# Patient Record
Sex: Male | Born: 1935
Health system: Southern US, Community
[De-identification: ages and names within clinical notes are randomized; demographics above are authoritative.]

## PROBLEM LIST (undated history)

## (undated) DIAGNOSIS — Z129 Encounter for screening for malignant neoplasm, site unspecified: Secondary | ICD-10-CM

## (undated) DIAGNOSIS — R030 Elevated blood-pressure reading, without diagnosis of hypertension: Secondary | ICD-10-CM

## (undated) DIAGNOSIS — IMO0001 Reserved for inherently not codable concepts without codable children: Secondary | ICD-10-CM

## (undated) DIAGNOSIS — S6990XA Unspecified injury of unspecified wrist, hand and finger(s), initial encounter: Secondary | ICD-10-CM

## (undated) HISTORY — DX: Encounter for screening for malignant neoplasm, site unspecified: Z12.9

## (undated) HISTORY — DX: Unspecified injury of unspecified wrist, hand and finger(s), initial encounter: S69.90XA

## (undated) HISTORY — DX: Reserved for inherently not codable concepts without codable children: IMO0001

## (undated) HISTORY — DX: Elevated blood-pressure reading, without diagnosis of hypertension: R03.0

---

## 2001-08-23 HISTORY — PX: SIGMOIDOSCOPY: SUR1295

## 2010-08-23 DIAGNOSIS — Z129 Encounter for screening for malignant neoplasm, site unspecified: Secondary | ICD-10-CM

## 2010-08-23 DIAGNOSIS — S6990XA Unspecified injury of unspecified wrist, hand and finger(s), initial encounter: Secondary | ICD-10-CM

## 2010-08-23 HISTORY — DX: Unspecified injury of unspecified wrist, hand and finger(s), initial encounter: S69.90XA

## 2010-08-23 HISTORY — DX: Encounter for screening for malignant neoplasm, site unspecified: Z12.9

## 2011-05-07 ENCOUNTER — Emergency Department (INDEPENDENT_AMBULATORY_CARE_PROVIDER_SITE_OTHER): Payer: Medicare PPO

## 2011-05-07 ENCOUNTER — Emergency Department (HOSPITAL_BASED_OUTPATIENT_CLINIC_OR_DEPARTMENT_OTHER)
Admission: EM | Admit: 2011-05-07 | Discharge: 2011-05-07 | Disposition: A | Discharge status: Home / Self Care | Payer: Medicare PPO | Attending: Emergency Medicine | Admitting: Emergency Medicine

## 2011-05-07 ENCOUNTER — Emergency Department (HOSPITAL_BASED_OUTPATIENT_CLINIC_OR_DEPARTMENT_OTHER): Payer: Medicare PPO

## 2011-05-07 ENCOUNTER — Encounter: Payer: Self-pay | Admitting: *Deleted

## 2011-05-07 DIAGNOSIS — T07XXXA Unspecified multiple injuries, initial encounter: Secondary | ICD-10-CM

## 2011-05-07 DIAGNOSIS — W19XXXA Unspecified fall, initial encounter: Secondary | ICD-10-CM

## 2011-05-07 DIAGNOSIS — S025XXA Fracture of tooth (traumatic), initial encounter for closed fracture: Secondary | ICD-10-CM | POA: Insufficient documentation

## 2011-05-07 DIAGNOSIS — W010XXA Fall on same level from slipping, tripping and stumbling without subsequent striking against object, initial encounter: Secondary | ICD-10-CM | POA: Insufficient documentation

## 2011-05-07 DIAGNOSIS — S63259A Unspecified dislocation of unspecified finger, initial encounter: Secondary | ICD-10-CM | POA: Insufficient documentation

## 2011-05-07 DIAGNOSIS — M25549 Pain in joints of unspecified hand: Secondary | ICD-10-CM

## 2011-05-07 MED ORDER — LIDOCAINE HCL (PF) 1 % IJ SOLN
5.0000 mL | Freq: Once | INTRAMUSCULAR | Status: AC
  Administered 2011-05-07: 5 mL

## 2011-05-07 MED ORDER — TETANUS-DIPHTH-ACELL PERTUSSIS 5-2.5-18.5 LF-MCG/0.5 IM SUSP
0.5000 mL | Freq: Once | INTRAMUSCULAR | Status: AC
  Administered 2011-05-07: 0.5 mL via INTRAMUSCULAR
  Filled 2011-05-07: qty 0.5

## 2011-05-07 MED ORDER — LIDOCAINE HCL (PF) 1 % IJ SOLN
INTRAMUSCULAR | Status: AC
  Administered 2011-05-07: 5 mL
  Filled 2011-05-07: qty 5

## 2011-05-07 NOTE — ED Notes (Signed)
Tripped and fell on his face. Broke 2 front teeth. Abrasions noted to his face and left hand. Bruise to is right 4th digit.

## 2011-05-07 NOTE — ED Provider Notes (Signed)
History     CSN: [id number] Arrival date & time: 05/07/2011 12:43 PM   Chief Complaint  Patient presents with  . Fall     (Include location/radiation/quality/duration/timing/severity/associated sxs/prior treatment) HPI The patient is an 77 year old male with no significant past medical history.  He presents to the emergency department after he fell over a curb and landed on his face.  He denies loss of consciousness, nausea, vision changes, neck pain.  He denies a headache.  He says he has mild discomfort in his upper lip, where he has an abrasion.  He points out an injury to his left small finger, which he says is nonpainful.  He denies significant pain anywhere.   History reviewed. No pertinent past medical history.   History reviewed. No pertinent past surgical history.  No family history on file.  History  Substance Use Topics  . Smoking status: Former Games developer  . Smokeless tobacco: Not on file  . Alcohol Use: No      Review of Systems  HENT: Negative for nosebleeds and neck pain.   Eyes: Negative for pain and visual disturbance.  Respiratory: Negative for shortness of breath.   Cardiovascular: Negative for chest pain.  Gastrointestinal: Negative for abdominal pain.  Musculoskeletal: Negative for back pain.  Skin:       Abrasions on the nose upper lip bilateral hands dorsally  Neurological: Negative for headaches.  Hematological: Bruises/bleeds easily.       Bruise on the dorsum of his left hand    Allergies  Review of patient's allergies indicates no known allergies.  Home Medications  No current outpatient prescriptions on file.  Physical Exam    BP 139/81  Pulse 51  Temp(Src) 97.4 F (36.3 C) (Oral)  Resp 20  SpO2 99%  Physical Exam  Constitutional: He appears well-developed and well-nourished.  HENT:  Head: Normocephalic.       Abrasions on his nose, and upper lip with a small laceration on his lip that he does not want repaired  Neck: Normal  range of motion. Neck supple.       No neck tenderness  Pulmonary/Chest: Effort normal.  Musculoskeletal:       There is ecchymoses on the ulnar and dorsal side of his left hand with a deformity of his left small finger is flexed at the PIP, and he is not able to extend it  Skin: Skin is warm and dry.       Abrasions to the nose face, bilateral hands  Psychiatric: He has a normal mood and affect.    ED Course  Procedures  DIGITAL BLOCK with lidocaine without epi for joint reduction. PIP left 5th digit reduction  Following an elderly male, with no significant injury to his head or axial skeleton.  He does have abrasions on his face and hands and a flexion deformity of his left small finger.  We will x-ray his hand and finger given a tetanus shot and cleaned.  His wound  No results found for this or any previous visit. No results found.   No diagnosis found.   MDM Facial abrasion Left small finger injury- dislocation- reduced in ed. Rennis Harding 1 dental fracture       Nicholes Stairs, MD 05/07/11 [phone]

## 2011-05-07 NOTE — ED Notes (Signed)
Pt's abrasions cleaned and covered with bacitracin. Pt tolerated well. No bleeding noted.

## 2011-09-27 ENCOUNTER — Encounter: Payer: Self-pay | Admitting: Sports Medicine

## 2011-09-27 ENCOUNTER — Ambulatory Visit (INDEPENDENT_AMBULATORY_CARE_PROVIDER_SITE_OTHER): Payer: Medicare PPO | Admitting: Sports Medicine

## 2011-09-27 DIAGNOSIS — R030 Elevated blood-pressure reading, without diagnosis of hypertension: Secondary | ICD-10-CM

## 2011-09-27 DIAGNOSIS — J329 Chronic sinusitis, unspecified: Secondary | ICD-10-CM

## 2011-09-27 DIAGNOSIS — M20022 Boutonniere deformity of left finger(s): Secondary | ICD-10-CM

## 2011-09-27 DIAGNOSIS — M9904 Segmental and somatic dysfunction of sacral region: Secondary | ICD-10-CM

## 2011-09-27 DIAGNOSIS — M20029 Boutonniere deformity of unspecified finger(s): Secondary | ICD-10-CM

## 2011-09-27 DIAGNOSIS — M999 Biomechanical lesion, unspecified: Secondary | ICD-10-CM

## 2011-09-27 DIAGNOSIS — M9903 Segmental and somatic dysfunction of lumbar region: Secondary | ICD-10-CM

## 2011-09-27 DIAGNOSIS — R7301 Impaired fasting glucose: Secondary | ICD-10-CM | POA: Insufficient documentation

## 2011-09-27 NOTE — Patient Instructions (Signed)
It was great to meet you today.  You have done an excellent job of taking care of yourself and right now I feel that you are very well tuned up.  I do not want to start any new medications but would like to see you back in 1 month for more OMT to help you with your back.    For your sinuses you can try the Netti Pot or Saline rinses.  Zyrtec or Claritin is a good Over the Counter Medication if you feel like your congestion is getting worse and you need something.  For you pinky finger, we will give you a splint to use at night and I want you stretch your finger every day multiple times per day.    I will see you back in around a month and look forward to getting to know you better.

## 2011-09-27 NOTE — Assessment & Plan Note (Signed)
Pt diet controlled with high activity level although decreased since a fall in Sept 2012.  Essentially a vegetarian with small to moderate amounts of grains.

## 2011-09-27 NOTE — Assessment & Plan Note (Signed)
Pt given long finger splint and instructed to wear at night. Not currently limiting his functional status.  Active stretching demonstrated and to be performed multiple times throughout the day

## 2011-09-27 NOTE — Assessment & Plan Note (Addendum)
132 / 82 on recheck on 09/27/2011 Stable - no medication additions at this time.

## 2011-09-30 ENCOUNTER — Encounter: Payer: Self-pay | Admitting: Sports Medicine

## 2011-09-30 DIAGNOSIS — M9902 Segmental and somatic dysfunction of thoracic region: Secondary | ICD-10-CM | POA: Insufficient documentation

## 2011-09-30 DIAGNOSIS — M9903 Segmental and somatic dysfunction of lumbar region: Secondary | ICD-10-CM | POA: Insufficient documentation

## 2011-09-30 DIAGNOSIS — M9905 Segmental and somatic dysfunction of pelvic region: Secondary | ICD-10-CM | POA: Insufficient documentation

## 2011-09-30 DIAGNOSIS — J329 Chronic sinusitis, unspecified: Secondary | ICD-10-CM | POA: Insufficient documentation

## 2011-09-30 NOTE — Assessment & Plan Note (Addendum)
Treated with OMT:  MFR (Myofascial Release) and ME (Muscle Energy), Still Technique and Long lever to PELVIS- tolerated procedure well

## 2011-09-30 NOTE — Assessment & Plan Note (Addendum)
Treated with OMT: MFR (Myofascial Release) and ME (Muscle Energy) to THORACICS- tolerated procedure well

## 2011-09-30 NOTE — Progress Notes (Signed)
Subjective:  Marc Collins is a 78 y.o. male presenting today to establish care as he has moved in this his Daughter due to concerns regarding his health.  He is currently able perform all ADLs and iADLs and returns to his home outside of [place] for 4 to 5 days at a time.  He does report having some increased issues with his balance although denies any falls within the last month.  He was seen in the ED in 06/2011 for a fall which resulted in a L sided hand injury.  Otherwise pt is helathy and takes no medications.  He has been previously followed by Dr. Lorelei Pont, DO in [facility] and has provided labs from his last visit.    ROS  Constitutional Reports some decreased activity since his fall in sept but denies any focal weakness, no generalized fatigue, malise, no unexplained wt loss  Infectious No cough, no congestion, no fevers, no chills  Resp No cough  Cardiac No palpitations, no chest pains  GI No N/V, or reflux, no constipation, or diarrhea  GU No dysuria, frequency, hesistancy  Skin No abnormal bruising  Psych Denies feelings of depression or anxiety  Neuro No focal weakness, dysarthia, or tremors  MSK REports some R sided hip pain since his fall and occasional misstep but no focal weakness or pain; L 5th finger with contracture  Trauma None since fall in sept 2012  Activity Highly active able to perform any activity he wishes  Diet Eats a variety of foods and has high vegetable intake, "essentially vegetarian"  MEDS On no meds and reports not wanting to start unless clearly indicated    Past Medical Hx Reviewed: yes Medications Reviewed: yes Family History Reviewed: yes   PE: GENERAL:  Elderly, caucasian male examined in [facility], in no acute discomfort, in no resp distress HNEENT: AT/[place], MMM, no scleral icterus, EOMi THORAX: HEART: irregular rhythm but coordinated with resp effort, S1/S2 heard, no murmur LUNGS: CTA B, no wheezes, no crackles ABDOMEN:  +BS,  soft, non-tender, no rigidity, no guarding, no masses/organomegaly EXTREMITIES: Moves all 4 extremities spontaneously, warm well perfused, no edema, bilateral DP pulses 1+/4.  L hand with flexure contracture of 5th PIP - unable to straighten >MSK/Osteopathic:  OSTEOPATHIC FINDINGS: THORACIC  T8-T12 rRsL  LUMBAR  L3 rRsL  PELVIS  R anterior rotation

## 2011-09-30 NOTE — Assessment & Plan Note (Addendum)
Pt currently denying symptoms.  Pt has no reported OTC treatments.  Discussed OTC 2nd gen antihistamine and Saline spray/rinse

## 2011-09-30 NOTE — Assessment & Plan Note (Addendum)
Treated with OMT: MFR (Myofascial Release) and ME (Muscle Energy) to lumbar spine - tolerated procedure well

## 2011-10-26 ENCOUNTER — Ambulatory Visit (INDEPENDENT_AMBULATORY_CARE_PROVIDER_SITE_OTHER): Payer: Medicare PPO | Admitting: Sports Medicine

## 2011-10-26 ENCOUNTER — Encounter: Payer: Self-pay | Admitting: Sports Medicine

## 2011-10-26 VITALS — BP 132/78 | HR 48 | Ht 72.0 in | Wt 178.0 lb

## 2011-10-26 DIAGNOSIS — M9903 Segmental and somatic dysfunction of lumbar region: Secondary | ICD-10-CM

## 2011-10-26 DIAGNOSIS — R030 Elevated blood-pressure reading, without diagnosis of hypertension: Secondary | ICD-10-CM

## 2011-10-26 DIAGNOSIS — M9905 Segmental and somatic dysfunction of pelvic region: Secondary | ICD-10-CM

## 2011-10-26 DIAGNOSIS — M9902 Segmental and somatic dysfunction of thoracic region: Secondary | ICD-10-CM

## 2011-10-26 DIAGNOSIS — M999 Biomechanical lesion, unspecified: Secondary | ICD-10-CM

## 2011-10-26 NOTE — Patient Instructions (Addendum)
It was great to see you today.  We worked on your ankles, pelvis, back and ribs today.  You may be slightly sore tomorrow - it is okay to take Aleeve if you need it and use ice or heat as needed.  I anticipate you will feel great in a day or two.  Take care of yourself when you return home.    Come back to see me in 1 month for to continue your medical care and for another musculoskeletal assessment.

## 2011-11-20 NOTE — Assessment & Plan Note (Signed)
Elevated after walking into visit.  On recheck appropriate.  Pt reports no elevations when he shelf checks at home. Continue to monitor - pt resistant to addition of meds   

## 2011-11-20 NOTE — Assessment & Plan Note (Signed)
Treated with OMT to Thoracics, Ribs, Lumbar, Pelvis with MFR (Myofascial Release), ME (Muscle Energy)  Pt tolerated treatments well

## 2011-11-20 NOTE — Progress Notes (Signed)
HPI:  Marc Collins is a 78 y.o. male presenting today for OMT.  He reports doing well since last visit.  He has had no falls.  He reports that his chronic low back pain is improved with manipulation.  It does feel like he is currently "out" of alignment especially the tips.   ROS Denies any chest pain, shortness of breath, recent respiratory illnesses, changes in bowel or bladder habits, or any medication or medical changes since last visit.  Past Medical Hx Reviewed: yes Medications Reviewed: yes Family History Reviewed: no   PE: GENERAL:  Elderly Adult Caucasian male male.  Examined in [facility].  Sitting comfortably in exam room.  In no discomfort; norespiratory distress.   PSYCH: Alert and appropriately interactive  HNEENT: AT/[place], MMM, no scleral icterus, EOMi THORAX: HEART: RRR, S1/S2 heard, no murmur LUNGS: CTA B, no wheezes, no crackles EXTREMITIES: Moves all 4 extremities spontaneously, warm well perfused, no edema, bilateral DP and PT pulses 2/4.   >MSK/Osteopathic: OSTEOPATHIC FINDINGS: THORACIC  t5-7 NRRSL    T11-L2 NRLSR  RIBS  R Rib 7 posterior  LUMBAR  L3RR  PELVIS  R psoas contracture  R Anterior pelvis  LE  R Fibular head anterior

## 2011-12-03 ENCOUNTER — Ambulatory Visit (INDEPENDENT_AMBULATORY_CARE_PROVIDER_SITE_OTHER): Payer: Medicare PPO | Admitting: Sports Medicine

## 2011-12-03 VITALS — BP 118/76 | HR 72 | Temp 97.8°F | Ht 72.0 in | Wt 176.9 lb

## 2011-12-03 DIAGNOSIS — M999 Biomechanical lesion, unspecified: Secondary | ICD-10-CM

## 2011-12-03 DIAGNOSIS — M9903 Segmental and somatic dysfunction of lumbar region: Secondary | ICD-10-CM

## 2011-12-03 DIAGNOSIS — M9902 Segmental and somatic dysfunction of thoracic region: Secondary | ICD-10-CM

## 2011-12-03 DIAGNOSIS — M9905 Segmental and somatic dysfunction of pelvic region: Secondary | ICD-10-CM

## 2011-12-03 DIAGNOSIS — R030 Elevated blood-pressure reading, without diagnosis of hypertension: Secondary | ICD-10-CM

## 2011-12-03 NOTE — Patient Instructions (Signed)
It was great to see you today.  I worked on your pelvis, sacrum, lumbar spine and thoracic spine today.  Your Left hip was rotated contributing to your "swagger".    Keep doing your dumbbell exercises and try to add in the hip lumbar rotation exercises that I showed you.  Perform 15 circles each direction with your feet together, then with your feet spread apart Left foot forward then right foot forward.  This will help keep your hips loose and make our treatments last longer.  Please return to see me in 4 to 6 weeks or sooner if needed.

## 2011-12-03 NOTE — Progress Notes (Signed)
HPI:  Whitman Meinhardt is a 78 y.o. male presenting today for follow up of his lumbar and pelvic assymetries and for a BP check.  Reports feeling good, no complications following adjustment previously.  Was doing well for 4-5 weeks but has lately feel like he has a "swagger"  ROS No falls, no chest pain, no palpitations   HISTORY Past Medical Hx Reviewed: yes Medications Reviewed: yes - no chronic Rx'd meds  PE: GENERAL:  Elderly Caucasian male.  Examined in [facility].   In no discomfort; norespiratory distress.   PSYCH: Alert and appropriately interactive  HNEENT: AT/[place], MMM, no scleral icterus, EOMi THORAX: HEART: Regular rate, regularly irregular rhythm,  S1/S2 heard, no murmur LUNGS: CTA b ABDOMEN:  Hyperactive BS, soft, non-tender >MSK/Osteopathic:  THORACIC  T6 RL  RIBS  P L Rib 6  LUMBAR  L3 FRLSL  L4 FRrSr  PELVIS  L posterior innominate

## 2011-12-09 ENCOUNTER — Encounter: Payer: Self-pay | Admitting: Sports Medicine

## 2011-12-09 DIAGNOSIS — M999 Biomechanical lesion, unspecified: Secondary | ICD-10-CM | POA: Insufficient documentation

## 2011-12-09 NOTE — Assessment & Plan Note (Signed)
OMT performed and tolerated treatment well: ME

## 2011-12-09 NOTE — Assessment & Plan Note (Signed)
OMT performed and tolerated well; STILL, ME

## 2011-12-09 NOTE — Assessment & Plan Note (Signed)
OMT Performed and tolerated well.  ME, CS

## 2011-12-09 NOTE — Assessment & Plan Note (Signed)
Elevated after walking into visit.  On recheck appropriate.  Pt reports no elevations when he shelf checks at home. Continue to monitor - pt resistant to addition of meds

## 2011-12-09 NOTE — Assessment & Plan Note (Signed)
OMT performed and tolerated well.  ME

## 2013-03-08 IMAGING — CR DG HAND COMPLETE 3+V*L*
4 series · 4 of 4 positions shown · non-contrast
Comparison: None.

CLINICAL DATA: Fell, pain

LEFT HAND - COMPLETE 3+ VIEW

[x hand pa left]
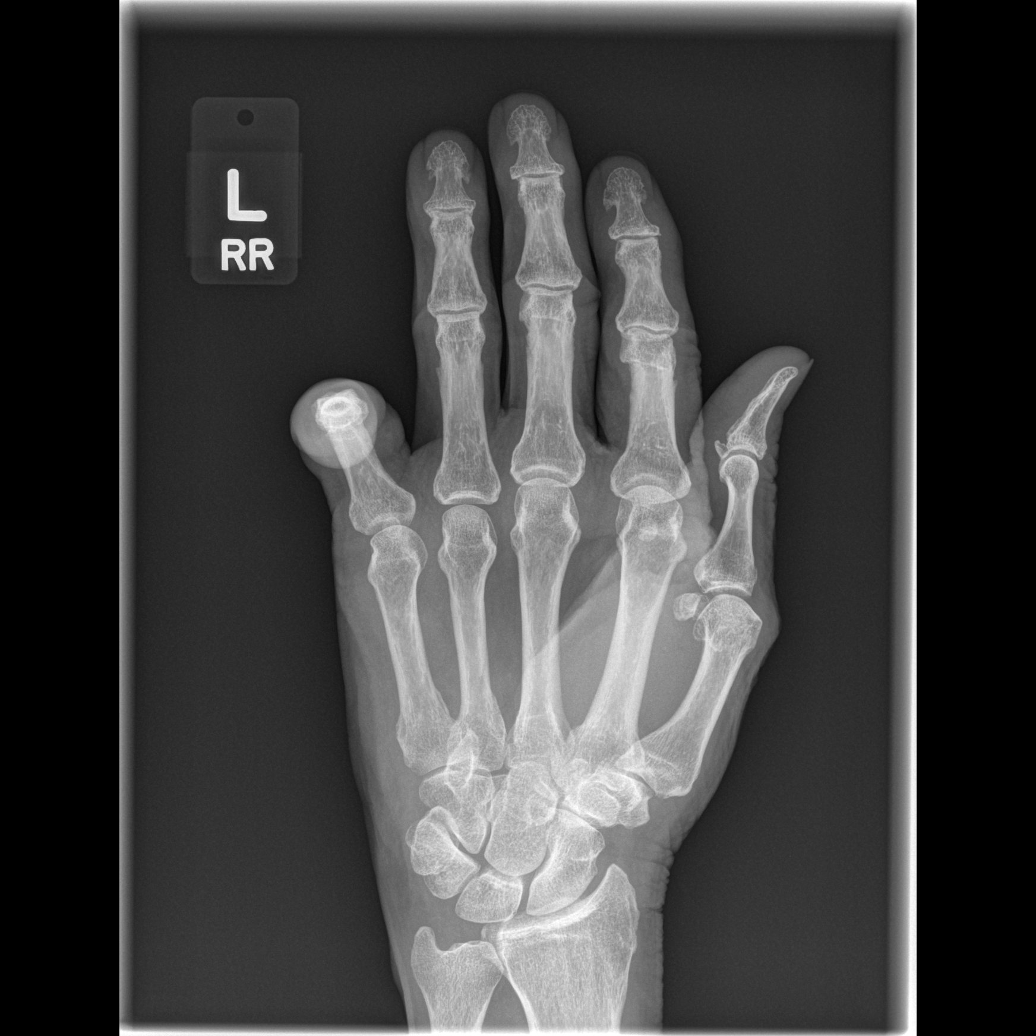

[x hand oblique left]
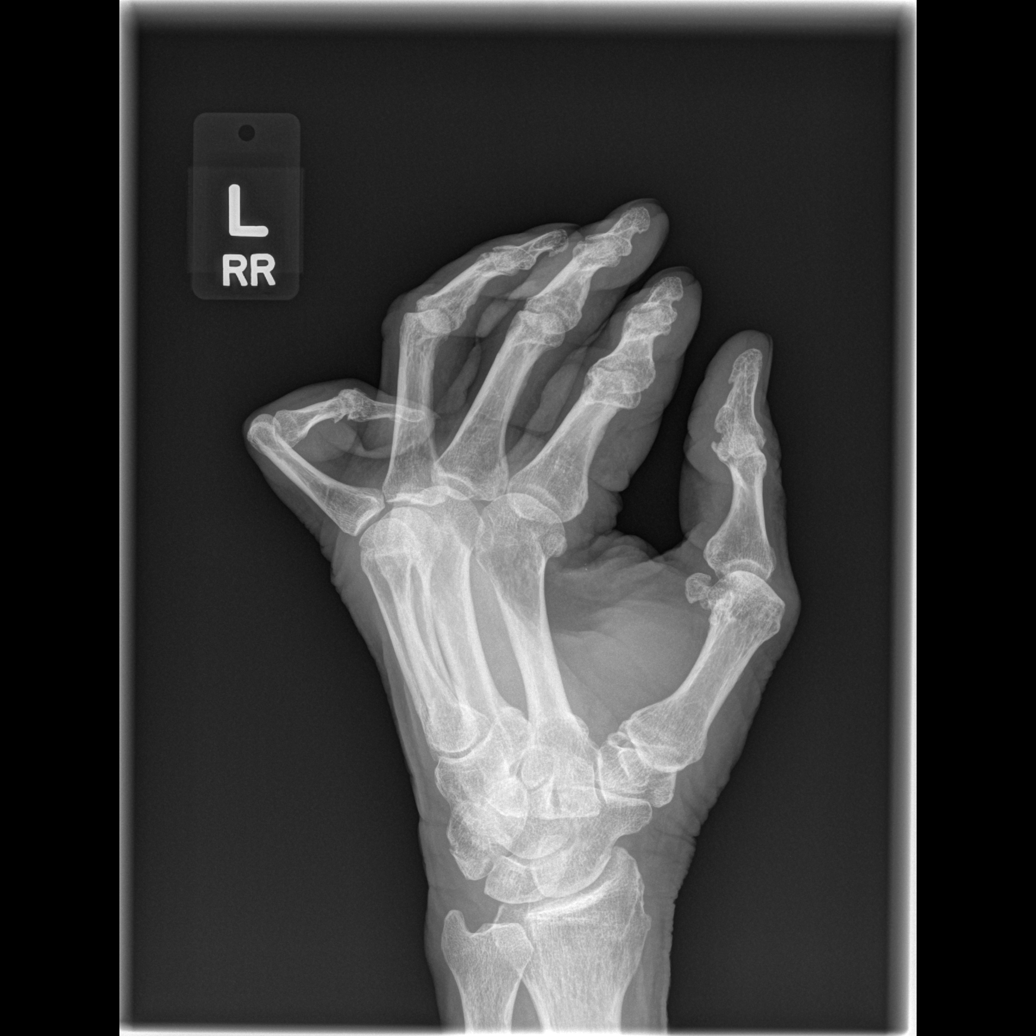

[x hand lat left (1 of 2)]
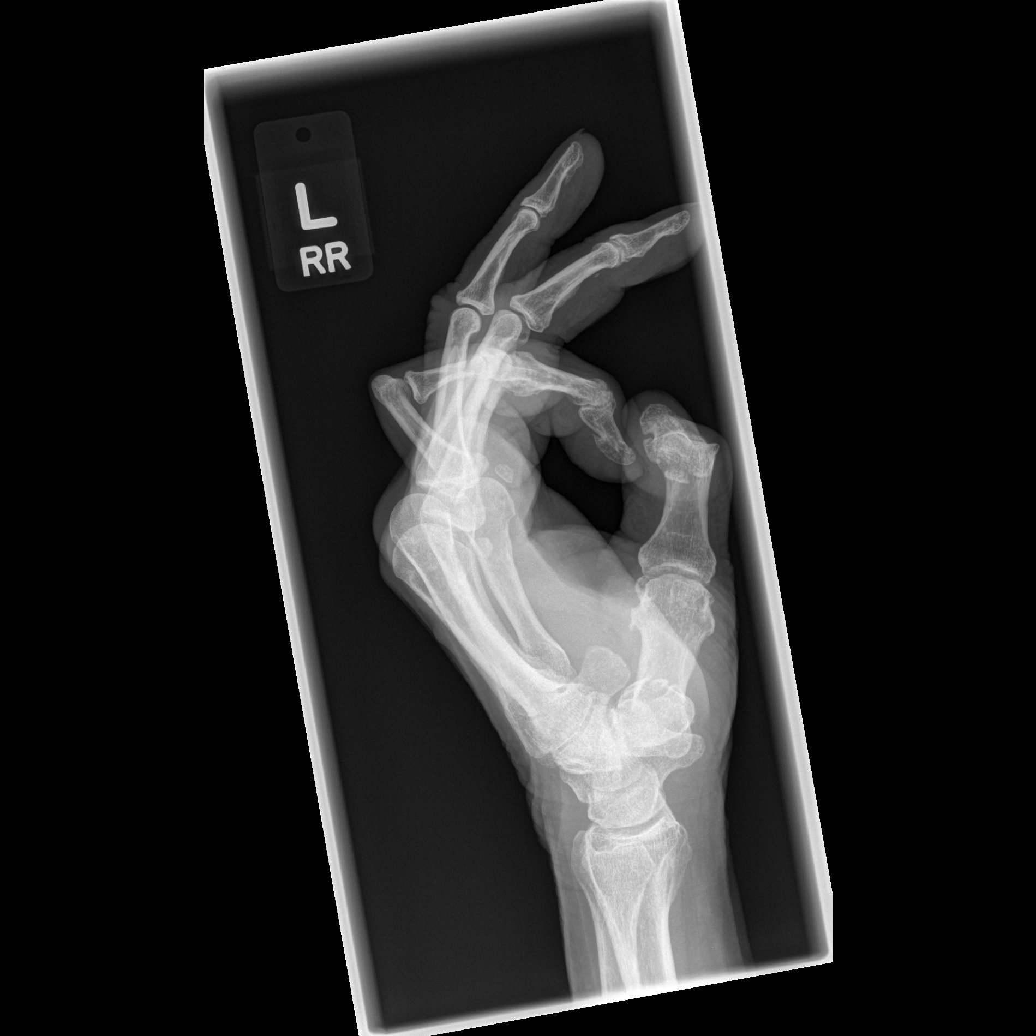

[x hand lat left (2 of 2)]
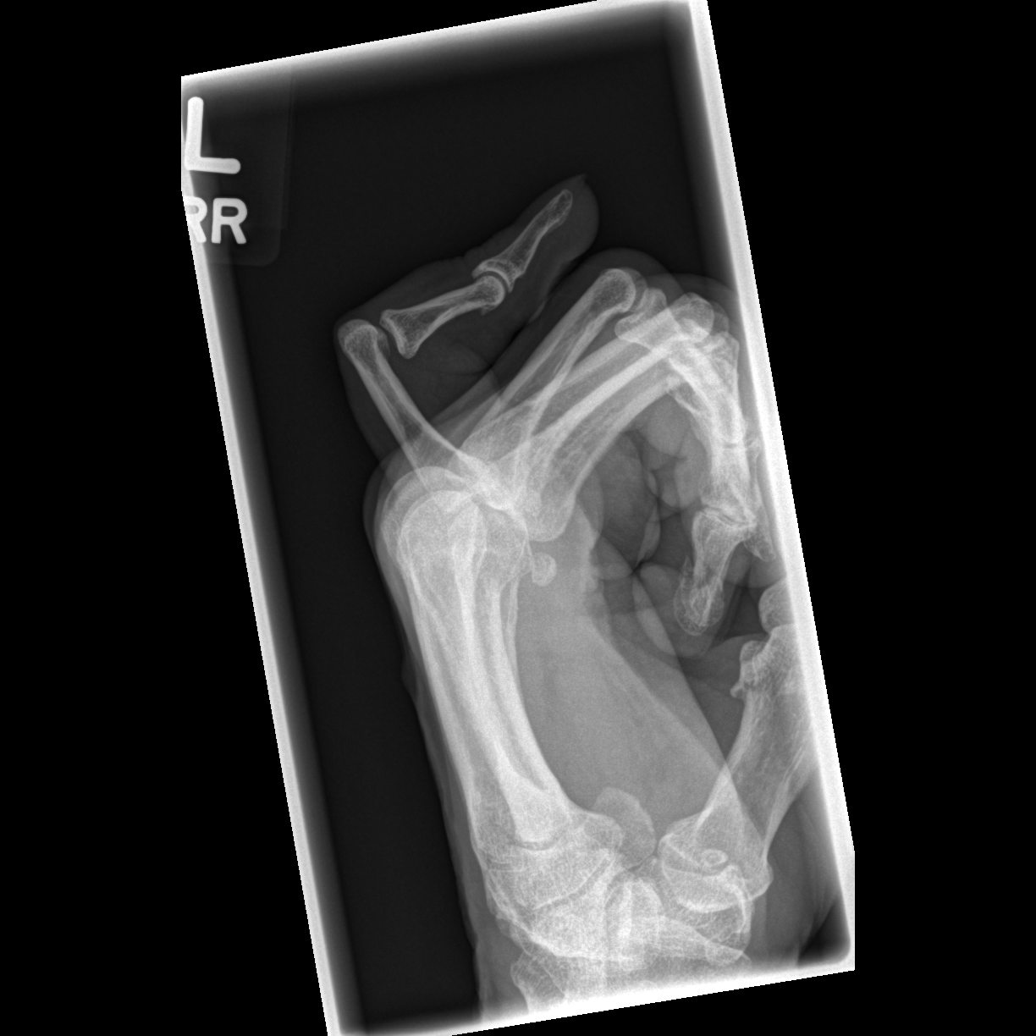

[4 of 4 positions shown; findings below may reference images not displayed]

FINDINGS: There is a dislocated the fifth finger PIP joint with the
distal finger directed toward the volar aspect.  There is no
visible fracture.  Mild degenerative changes seen throughout the
interphalangeal joints. Metacarpals and carpal bones appear intact.
No radiopaque foreign body.
IMPRESSION: Dislocated PIP joint fifth finger.  No definite associated
fracture.

## 2017-09-16 ENCOUNTER — Ambulatory Visit: Payer: PPO | Admitting: Podiatry

## 2017-09-16 ENCOUNTER — Encounter: Payer: Self-pay | Admitting: Podiatry

## 2017-09-16 VITALS — BP 162/92 | HR 95 | Ht 71.0 in | Wt 165.0 lb

## 2017-09-16 DIAGNOSIS — B351 Tinea unguium: Secondary | ICD-10-CM

## 2017-09-16 DIAGNOSIS — M79609 Pain in unspecified limb: Secondary | ICD-10-CM | POA: Diagnosis not present

## 2017-09-16 MED ORDER — FLUCONAZOLE 150 MG PO TABS: 150.0000 mg | ORAL_TABLET | ORAL | 1 refills | Status: AC

## 2017-09-16 NOTE — Progress Notes (Signed)
  Subjective:  Patient ID: Marc Collins, male    DOB: [birth year],  MRN: [id number]  Chief Complaint  Patient presents with  . Nail Problem    NP) thickening of the toe nail- curling- bleeding   84 y.o. male presents with the above complaint.  Ports bilateral painful thickened elongated toenails to both feet.  Denies diabetes.  Would like to discuss possible treatment for his fungal toenails.  Past Medical History:  Diagnosis Date  . Cancer screening 2012   PSA Normal  . Elevated BP   . Hand injury 2012   Resulted in Boutineer's Deformity of L 5th finger   Past Surgical History:  Procedure Laterality Date  . SIGMOIDOSCOPY  2003   Diverticulosis    Current Outpatient Medications:  .  aspirin EC 81 MG tablet, Take 81 mg by mouth daily., Disp: , Rfl:  .  loratadine (CLARITIN) 10 MG tablet, Take 10 mg by mouth daily., Disp: , Rfl:  .  Multiple Vitamin (MULTIVITAMIN) tablet, Take 1 tablet by mouth daily., Disp: , Rfl:  .  fluconazole (DIFLUCAN) 150 MG tablet, Take 1 tablet (150 mg total) by mouth once a week., Disp: 6 tablet, Rfl: 1  No Known Allergies Review of Systems Objective:   Vitals:   09/16/17 1419  BP: (!) 162/92  Pulse: 95   General AA&O x3. Normal mood and affect.  Vascular Dorsalis pedis and posterior tibial pulses  present 2+ bilaterally  Capillary refill normal to all digits. Pedal hair growth normal.  Neurologic Epicritic sensation grossly present.  Dermatologic No open lesions. Interspaces clear of maceration. Nails elongated and thickened with yellow discoloration crumbly texture brittle texture  Orthopedic: MMT 5/5 in dorsiflexion, plantarflexion, inversion, and eversion. Normal joint ROM without pain or crepitus.     Assessment & Plan:  Patient was evaluated and treated and all questions answered.  Onychomycosis -Educated on etiology -Rx weekly fluconazole. -Palliatively debrided  Procedure: Nail Debridement Rationale: pain Type of  Debridement: manual, sharp debridement. Instrumentation: Nail nipper, rotary burr. Number of Nails: 10  Return in about 6 months (around 03/16/2018).

## 2019-02-21 DEATH — deceased

## 2019-03-26 ENCOUNTER — Other Ambulatory Visit: Payer: Self-pay
